# Patient Record
Sex: Male | Born: 1975 | Race: Black or African American | Hispanic: No | Marital: Married | Smoking: Never smoker
Health system: Southern US, Community
[De-identification: ages and names within clinical notes are randomized; demographics above are authoritative.]

## PROBLEM LIST (undated history)

## (undated) DIAGNOSIS — E349 Endocrine disorder, unspecified: Secondary | ICD-10-CM

## (undated) DIAGNOSIS — Z9852 Vasectomy status: Secondary | ICD-10-CM

## (undated) HISTORY — PX: OTHER SURGICAL HISTORY: SHX169

---

## 2001-10-07 ENCOUNTER — Encounter: Admission: RE | Admit: 2001-10-07 | Discharge: 2001-10-07 | Payer: Self-pay | Admitting: Family Medicine

## 2001-10-07 ENCOUNTER — Encounter: Payer: Self-pay | Admitting: Family Medicine

## 2008-07-26 ENCOUNTER — Encounter: Admission: RE | Admit: 2008-07-26 | Discharge: 2008-07-27 | Payer: Self-pay | Admitting: Family Medicine

## 2008-08-09 ENCOUNTER — Encounter (INDEPENDENT_AMBULATORY_CARE_PROVIDER_SITE_OTHER): Payer: Self-pay | Admitting: Otolaryngology

## 2008-08-09 ENCOUNTER — Ambulatory Visit (HOSPITAL_BASED_OUTPATIENT_CLINIC_OR_DEPARTMENT_OTHER): Admission: RE | Admit: 2008-08-09 | Discharge: 2008-08-09 | Payer: Self-pay | Admitting: Otolaryngology

## 2008-08-09 HISTORY — PX: OTHER SURGICAL HISTORY: SHX169

## 2010-11-06 LAB — POCT HEMOGLOBIN-HEMACUE: Hemoglobin: 14 g/dL (ref 13.0–17.0)

## 2010-12-05 NOTE — Op Note (Signed)
NAME:  Jim Howard, Jim Howard NO.:  [id number]   MEDICAL RECORD NO.:  [id number]          PATIENT TYPE:  AMB   LOCATION:  DSC                          FACILITY:  [facility]   PHYSICIAN:  Karol T. Lazarus Salines, M.D. DATE OF BIRTH:  18-Nov-1975   DATE OF PROCEDURE:  08/09/2008  DATE OF DISCHARGE:                               OPERATIVE REPORT   PREOPERATIVE DIAGNOSIS:  Chronic tonsillolith formation.   POSTOPERATIVE DIAGNOSIS:  Chronic tonsillolith formation.   PROCEDURE PERFORMED:  Tonsillectomy, adenoid ablation.   SURGEON:  Gloris Manchester. Wolicki, MD   ANESTHESIA:  General orotracheal.   BLOOD LOSS:  25 mL.   COMPLICATIONS:  None.   FINDINGS:  A very deep mandible with a bulky tongue.  A fleshy pharynx  with 2-3+ deeply embedded and fibrotic tonsils.  A long thick soft  palate and central uvula.  A 50% residual adenoid pad.   PROCEDURE:  With the patient in a comfortable supine position, general  orotracheal anesthesia was induced without difficulty.  At an  appropriate level, the table was turned 90 degrees and the patient was  placed in Trendelenburg.  A clean preparation and draping was  accomplished.  Taking care to protect lips, teeth, and endotracheal  tube, the Crowe-Davis mouth gag was introduced, expanded for  visualization, and suspended from the Mayo stand in the standard  fashion.  The blade was adequately wide, but not long enough.  The gag  was relaxed and removed.  A #4 flat blade was placed and this was  successfully positioned and expanded.  The findings were as described  above.  The nasopharynx was examined with the palate retractor and  mirror with the findings as described above.  Xylocaine 1.5% with  1:200,000 epinephrine, 10 mL total was infiltrated into the  peritonsillar planes on both sides.  Several minutes were allowed for  this to take effect.   Beginning on the right side, the tonsil was grasped and retracted  medially.  The mucosa overlying the  anterior and superior poles was  coagulated and then cut down to the capsule of the tonsil.  Using the  cautery tip as an blunt dissector, lysing fibrous bands, and coagulating  crossing vessels as identified, the tonsil was dissected from its  muscular fossa from inferiorly upwards.  The tonsil was removed in its  entirety as determined by examination of both tonsil and fossa.  A small  additional quantity of cautery rendered the fossa hemostatic.   The mouth gag was repositioned and the left tonsil was removed in  identical fashion.   After rendering the oropharynx hemostatic, there was still some bleeding  from the nasopharynx where palpation had revealed the presence of the  adenoids.  A red rubber catheter was passed through the nose and out of  the mouth to serve the palate retractor and using suction cautery in  indirect visualization, the adenoid pad was gently ablated to reduce its  volume and also for hemostasis.  This was done in several passes using  irrigation through acuteraly localize the bleeding sites.  Upon  achieving hemostasis  in the nasopharynx, the oropharynx was again  observed to be hemostatic.  At this point, the palate retractor and  mouth gag were relaxed for several minutes.  Upon re-expansion,  hemostasis was persistently freed.  At this point, the procedure was  completed.  The palate retractor and mouth gag were relaxed and removed.  The dental status was intact.  The patient was returned to anesthesia,  awakened, extubated, and transferred to recovery in stable condition.   COMMENT:  A 35 year old black male with a long history of tonsillolith  production gradually progressive including foreign body sensation in the  throat, sore throat, and mostly halitosis with the indication for  today's procedure.  Anticipate a routine postoperative recovery with  attention to analgesia, antibiosis, hydration, and observation for  bleeding, emesis, or airway  compromise.  The patient has a family  history of malignant hyperthermia and all appropriate precautions were  taken with no evidence of this disease process.  In conference  to the  Anesthesiology, it was felt appropriate for the patient to have this  surgery as an outpatient if his other parameters are intact.      Gloris Manchester. Lazarus Salines, M.D.  Electronically Signed     KTW/MEDQ  D:  08/09/2008  T:  08/10/2008  Job:  [id number]

## 2011-11-06 ENCOUNTER — Other Ambulatory Visit: Payer: Self-pay | Admitting: Urology

## 2011-11-20 ENCOUNTER — Encounter (HOSPITAL_BASED_OUTPATIENT_CLINIC_OR_DEPARTMENT_OTHER): Payer: Self-pay | Admitting: *Deleted

## 2011-11-20 NOTE — Progress Notes (Signed)
NPO AFTER MN. ARRIVES AT 0845. NEEDS HG. 

## 2011-11-21 NOTE — H&P (Signed)
1. Fatigue 780.79 2. 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 Inquiry And Counseling: Contraceptive Practices V25.09 4. Surgery Of Male Genitalia Vasectomy V25.2  History of Present Illness  Jim Howard returns today in f/u.  He has a history of a vasectomy in November but I was unable to get to the left side because of prior surgery for an undescended testicle.  He has had a reduction in sperm in his semenalysis with only 40/HPF prior to this visit but they were 40% motile.  He has hypogonadism and had been on clomid, but he stopped using it regularly and his last testosterone was down.  He is back on it now.   Past Medical History Problems  1. History of  Hypercholesterolemia 272.0  Surgical History Problems  1. History of  Hernia Repair 2. Surgery Of Male Genitalia Vasectomy V25.2 3. History of  Tonsillectomy  Current Meds 1. ClomiPHENE Citrate 50 MG Oral Tablet; Take 1/2 tab every other day for a week and then a whole  tablet every other day.   #30; Therapy: 08Nov2012 to (Last Rx:14Jan2013)  Requested for:  14Jan2013  Allergies Medication  1. No Known Drug Allergies  Family History Problems  1. Family history of  Family Health Status Number Of Children 1 son and 2 daughters  Social History Problems  1. Being A Social Drinker 2. Caffeine Use less than 1 day 3. Marital History - Currently Married 4. Never A Smoker 5. Occupation: [employer]  Review of Systems Genitourinary, constitutional, skin, eye, otolaryngeal, hematologic/lymphatic, cardiovascular, pulmonary, endocrine, musculoskeletal, gastrointestinal, neurological and psychiatric system(s) were reviewed and pertinent findings if present are noted.    Physical Exam Constitutional: Well nourished and well developed . No acute distress.  Pulmonary: No respiratory distress and normal respiratory rhythm and effort.  Cardiovascular: Heart rate and rhythm are normal . No peripheral edema.  Genitourinary: Examination of the penis demonstrates no  discharge, no masses, no lesions and a normal meatus. The scrotum is without lesions. The left vas deferens is difficult to palpate. The right epididymis is palpably normal and non-tender. The left epididymis is palpably normal and non-tender. The right testis is non-tender and without masses. The left testis is atrophic, but non-tender and without masses.  The left vas is difficult to palpate in his somewhat scarred cord from his prior orchidopexy.    Results/Data Selected Results  SEMEN ANALYSIS POST VAS 08Apr2013 08:51AM Jim Howard  SPECIMEN TYPE: OTHER   Test Name Result Flag Reference  SEMEN EXAM, POST VASECTOMY Sperm Present    40 SPERM SEEN WITH 40% MOTILITY/20HPF.   Reference Range:  No sperm noted   TESTOSTERONE 08Apr2013 08:47AM Jim Howard  SPECIMEN TYPE: BLOOD   Test Name Result Flag Reference  TESTOSTERONE, TOTAL 164.27 ng/dL L 268-341   Please note change in reference range(s).              Tanner Stage       Male              Male               I              < 30 ng/dL        < 10 ng/dL               II             < 150 ng/dL       < 30 ng/dL  III            100-320 ng/dL     < 35 ng/dL               IV             200-970 ng/dL     16-10 ng/dL               V              250-890 ng/dL     96-04 ng/dL   PSA REFLEX TO FREE [id number] 08:47AM Jim Howard  SPECIMEN TYPE: BLOOD   Test Name Result Flag Reference  PSA 0.39 ng/mL  <=4.00  Test Methodology: ECLIA PSA (Electrochemiluminescence Immunoassay)   For PSA values from 2.5-4.0, particularly in younger men <47 years old, the AUA and NCCN suggest testing for % Free PSA (3515) and evaluation of the rate of increase in PSA (PSA velocity).   CBC W/DIFF 08Apr2013 08:46AM Jim Howard  SPECIMEN TYPE: BLOOD   Test Name Result Flag Reference  WBC 6.0 K/uL  4.0-10.5  RBC 4.89 MIL/uL  4.22-5.81  HEMOGLOBIN 14.0 g/dL  47.8-29.5  HEMATOCRIT 42.5 %  39.0-52.0  MCV 86.9 fL  78.0-100.0  MCH 28.6 pg   26.0-34.0  MCHC 32.9 g/dL  62.1-30.8  RDW 65.7 %  11.5-15.5  PLATELET COUNT 234 K/uL  150-400  GRANULOCYTE % 44 %  43-77  LYMPH % 41 %  12-46  MONO % 10 %  3-12  EOS % 6 % H 0-5  BASO % 0 %  0-1  ABSOLUTE GRAN 2.6 K/uL  1.7-7.7  ABSOLUTE LYMPH 2.4 K/uL  0.7-4.0  ABSOLUTE MONO 0.6 K/uL  0.1-1.0  ABSOLUTE EOS 0.3 K/uL  0.0-0.7  ABSOLUTE BASO 0.0 K/uL  0.0-0.1  Criteria for review not met   Assessment Assessed  1. 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 Inquiry And Counseling: Contraceptive Practices V25.09   His testosterone fell off of the clomid and he noticed the decline. He still has motile sperm in the ejaculate and wants to go ahead and have the left vas dealt with.   Plan Hypogonadism (257.2)  1. ClomiPHENE Citrate 50 MG Oral Tablet; Take 1/2 tab every other day for a week and then a whole  tablet every other day.   #30; Therapy: 08Nov2012 to (Last Rx:15Apr2013)  Requested for:  15Apr2013 2. Follow-up Month x 3 Office  Follow-up  Requested for: 15Apr2013 3. TESTOSTERONE  Requested for: 15Apr2013 Inquiry And Counseling: Contraceptive Practices (V25.09)  4. Follow-up Schedule Surgery Office  Follow-up  Requested for: 15Apr2013   He will resume the clomid.  We did discuss TRT but he doesn't want to go that route at this time. I will set him up for a left vasectomy under anesthesia and he is aware of the risks from our prior discussion.

## 2011-11-22 ENCOUNTER — Encounter (HOSPITAL_BASED_OUTPATIENT_CLINIC_OR_DEPARTMENT_OTHER): Payer: Self-pay | Admitting: Anesthesiology

## 2011-11-22 ENCOUNTER — Encounter (HOSPITAL_BASED_OUTPATIENT_CLINIC_OR_DEPARTMENT_OTHER)
Admission: RE | Disposition: A | Discharge status: Home / Self Care | Payer: Self-pay | Source: Ambulatory Visit | Attending: Urology

## 2011-11-22 ENCOUNTER — Ambulatory Visit (HOSPITAL_BASED_OUTPATIENT_CLINIC_OR_DEPARTMENT_OTHER): Payer: BC Managed Care – PPO | Admitting: Anesthesiology

## 2011-11-22 ENCOUNTER — Ambulatory Visit (HOSPITAL_BASED_OUTPATIENT_CLINIC_OR_DEPARTMENT_OTHER)
Admission: RE | Admit: 2011-11-22 | Discharge: 2011-11-22 | Disposition: A | Discharge status: Home / Self Care | Payer: BC Managed Care – PPO | Source: Ambulatory Visit | Attending: Urology | Admitting: Urology

## 2011-11-22 ENCOUNTER — Encounter (HOSPITAL_BASED_OUTPATIENT_CLINIC_OR_DEPARTMENT_OTHER): Payer: Self-pay | Admitting: *Deleted

## 2011-11-22 DIAGNOSIS — IMO0002 Reserved for concepts with insufficient information to code with codable children: Secondary | ICD-10-CM | POA: Insufficient documentation

## 2011-11-22 DIAGNOSIS — E291 Testicular hypofunction: Secondary | ICD-10-CM | POA: Insufficient documentation

## 2011-11-22 DIAGNOSIS — Z79899 Other long term (current) drug therapy: Secondary | ICD-10-CM | POA: Insufficient documentation

## 2011-11-22 DIAGNOSIS — E7800 Pure hypercholesterolemia, unspecified: Secondary | ICD-10-CM | POA: Insufficient documentation

## 2011-11-22 DIAGNOSIS — Q539 Undescended testicle, unspecified: Secondary | ICD-10-CM | POA: Insufficient documentation

## 2011-11-22 DIAGNOSIS — Y838 Other surgical procedures as the cause of abnormal reaction of the patient, or of later complication, without mention of misadventure at the time of the procedure: Secondary | ICD-10-CM | POA: Insufficient documentation

## 2011-11-22 DIAGNOSIS — Z302 Encounter for sterilization: Secondary | ICD-10-CM | POA: Insufficient documentation

## 2011-11-22 HISTORY — DX: Vasectomy status: Z98.52

## 2011-11-22 HISTORY — DX: Endocrine disorder, unspecified: E34.9

## 2011-11-22 HISTORY — PX: VASECTOMY: SHX75

## 2011-11-22 SURGERY — VASECTOMY
Anesthesia: General | Site: Scrotum | Laterality: Left | Wound class: Clean

## 2011-11-22 MED ORDER — ACETAMINOPHEN 650 MG RE SUPP: 650.0000 mg | RECTAL | Status: DC | PRN

## 2011-11-22 MED ORDER — SODIUM CHLORIDE 0.9 % IJ SOLN: 3.0000 mL | Freq: Two times a day (BID) | INTRAMUSCULAR | Status: DC

## 2011-11-22 MED ORDER — FENTANYL CITRATE 0.05 MG/ML IJ SOLN: 25.0000 ug | INTRAMUSCULAR | Status: DC | PRN

## 2011-11-22 MED ORDER — LACTATED RINGERS IV SOLN
INTRAVENOUS | Status: DC
  Administered 2011-11-22: 12:00:00 via INTRAVENOUS

## 2011-11-22 MED ORDER — HYDROCODONE-ACETAMINOPHEN 5-325 MG PO TABS: 1.0000 | ORAL_TABLET | Freq: Four times a day (QID) | ORAL | Status: AC | PRN

## 2011-11-22 MED ORDER — SODIUM CHLORIDE 0.9 % IV SOLN: 250.0000 mL | INTRAVENOUS | Status: DC | PRN

## 2011-11-22 MED ORDER — ONDANSETRON HCL 4 MG/2ML IJ SOLN: 4.0000 mg | Freq: Four times a day (QID) | INTRAMUSCULAR | Status: DC | PRN

## 2011-11-22 MED ORDER — LIDOCAINE HCL (CARDIAC) 20 MG/ML IV SOLN
INTRAVENOUS | Status: DC | PRN
  Administered 2011-11-22: 60 mg via INTRAVENOUS

## 2011-11-22 MED ORDER — ACETAMINOPHEN 325 MG PO TABS: 650.0000 mg | ORAL_TABLET | ORAL | Status: DC | PRN

## 2011-11-22 MED ORDER — ONDANSETRON HCL 4 MG/2ML IJ SOLN
INTRAMUSCULAR | Status: DC | PRN
  Administered 2011-11-22: 4 mg via INTRAVENOUS

## 2011-11-22 MED ORDER — OXYCODONE HCL 5 MG PO TABS: 5.0000 mg | ORAL_TABLET | ORAL | Status: DC | PRN

## 2011-11-22 MED ORDER — CEFAZOLIN SODIUM 1-5 GM-% IV SOLN
1.0000 g | INTRAVENOUS | Status: AC
  Administered 2011-11-22: 2 g via INTRAVENOUS

## 2011-11-22 MED ORDER — FENTANYL CITRATE 0.05 MG/ML IJ SOLN
INTRAMUSCULAR | Status: DC | PRN
  Administered 2011-11-22 (×2): 50 ug via INTRAVENOUS

## 2011-11-22 MED ORDER — SODIUM CHLORIDE 0.9 % IJ SOLN: 3.0000 mL | INTRAMUSCULAR | Status: DC | PRN

## 2011-11-22 MED ORDER — PROPOFOL 10 MG/ML IV EMUL
INTRAVENOUS | Status: DC | PRN
  Administered 2011-11-22: 200 mg via INTRAVENOUS

## 2011-11-22 MED ORDER — BUPIVACAINE HCL 0.25 % IJ SOLN
INTRAMUSCULAR | Status: DC | PRN
  Administered 2011-11-22: 5 mL

## 2011-11-22 MED ORDER — 0.9 % SODIUM CHLORIDE (POUR BTL) OPTIME
TOPICAL | Status: DC | PRN
  Administered 2011-11-22: 500 mL

## 2011-11-22 MED ORDER — KETOROLAC TROMETHAMINE 30 MG/ML IJ SOLN
INTRAMUSCULAR | Status: DC | PRN
  Administered 2011-11-22: 30 mg via INTRAVENOUS

## 2011-11-22 MED ORDER — LACTATED RINGERS IV SOLN
INTRAVENOUS | Status: DC
  Administered 2011-11-22: 10:00:00 via INTRAVENOUS

## 2011-11-22 MED ORDER — PROMETHAZINE HCL 25 MG/ML IJ SOLN: 6.2500 mg | INTRAMUSCULAR | Status: DC | PRN

## 2011-11-22 MED ORDER — LIDOCAINE HCL (PF) 1 % IJ SOLN: INTRAMUSCULAR | Status: DC | PRN

## 2011-11-22 MED ORDER — DEXAMETHASONE SODIUM PHOSPHATE 4 MG/ML IJ SOLN
INTRAMUSCULAR | Status: DC | PRN
  Administered 2011-11-22: 10 mg via INTRAVENOUS

## 2011-11-22 MED ORDER — MIDAZOLAM HCL 5 MG/5ML IJ SOLN
INTRAMUSCULAR | Status: DC | PRN
  Administered 2011-11-22: 2 mg via INTRAVENOUS

## 2011-11-22 SURGICAL SUPPLY — 35 items
BANDAGE GAUZE ELAST BULKY 4 IN (GAUZE/BANDAGES/DRESSINGS) ×2 IMPLANT
BLADE SURG 15 STRL LF DISP TIS (BLADE) ×1 IMPLANT
BLADE SURG 15 STRL SS (BLADE) ×1
BLADE SURG ROTATE 9660 (MISCELLANEOUS) ×2 IMPLANT
CLEANER CAUTERY TIP 5X5 PAD (MISCELLANEOUS) ×1 IMPLANT
CLOTH BEACON ORANGE TIMEOUT ST (SAFETY) ×2 IMPLANT
COVER MAYO STAND STRL (DRAPES) ×2 IMPLANT
COVER TABLE BACK 60X90 (DRAPES) ×2 IMPLANT
DRAPE PED LAPAROTOMY (DRAPES) ×2 IMPLANT
ELECT NEEDLE TIP 2.8 STRL (NEEDLE) ×2 IMPLANT
ELECT REM PT RETURN 9FT ADLT (ELECTROSURGICAL) ×2
ELECTRODE REM PT RTRN 9FT ADLT (ELECTROSURGICAL) ×1 IMPLANT
GLOVE BIOGEL PI IND STRL 6.5 (GLOVE) ×2 IMPLANT
GLOVE BIOGEL PI INDICATOR 6.5 (GLOVE) ×2
GLOVE ECLIPSE 6.0 STRL STRAW (GLOVE) ×2 IMPLANT
GLOVE SURG SS PI 8.0 STRL IVOR (GLOVE) ×2 IMPLANT
GOWN SURGICAL LARGE (GOWNS) ×2 IMPLANT
GOWN W/COTTON TOWEL STD LRG (GOWNS) ×2 IMPLANT
GOWN XL W/COTTON TOWEL STD (GOWNS) ×2 IMPLANT
MARKER SKIN DUAL TIP RULER LAB (MISCELLANEOUS) ×2 IMPLANT
NEEDLE 27GAX1X1/2 (NEEDLE) IMPLANT
NEEDLE HYPO 25X1 1.5 SAFETY (NEEDLE) ×2 IMPLANT
NS IRRIG 500ML POUR BTL (IV SOLUTION) ×2 IMPLANT
PACK BASIN DAY SURGERY FS (CUSTOM PROCEDURE TRAY) ×2 IMPLANT
PAD CLEANER CAUTERY TIP 5X5 (MISCELLANEOUS) ×1
PENCIL BUTTON HOLSTER BLD 10FT (ELECTRODE) ×2 IMPLANT
SUPPORT SCROTAL LG STRP (MISCELLANEOUS) ×2 IMPLANT
SUT CHROMIC 3 0 SH 27 (SUTURE) ×4 IMPLANT
SUT VIC AB 2-0 SH 27 (SUTURE) ×1
SUT VIC AB 2-0 SH 27XBRD (SUTURE) ×1 IMPLANT
SUT VICRYL 0 TIES 12 18 (SUTURE) ×2 IMPLANT
SYR CONTROL 10ML LL (SYRINGE) IMPLANT
TOWEL NATURAL 6PK STERILE (DISPOSABLE) ×2 IMPLANT
TOWEL OR 17X24 6PK STRL BLUE (TOWEL DISPOSABLE) ×2 IMPLANT
WATER STERILE IRR 500ML POUR (IV SOLUTION) IMPLANT

## 2011-11-22 NOTE — Discharge Instructions (Addendum)
Vasectomy Care After Refer to this sheet in the next few weeks. These instructions provide you with information on caring for yourself after your procedure. Your caregiver may also give you more specific instructions. Your treatment has been planned according to current medical practices, but problems sometimes occur. Call your caregiver if you have any problems or questions after your procedure. HOME CARE INSTRUCTIONS   Only take over-the-counter or prescription medicines for pain, discomfort, or fever as directed by your caregiver.   Avoid using non-steroidal anti-inflammatory drugs (NSAID's) as these can make bleeding worse.   An ice pack for twenty to thirty minutes, 4 to 6 times per day for the first 2 or 3 days, will help decrease swelling.   Avoid being active for the first 2 days following surgery.   Wear a supporter while moving around for the first week following surgery.   Some oozing of blood from the cuts (incisions) by the surgeon is normal during the first day or two following the procedure.   Do not participate in sports or perform heavy physical labor for at least two weeks.   Blood in the ejaculate is common and typically clears after a few days.   Be sure to follow-up with your surgeon as instructed to confirm sterility.  SEEK MEDICAL CARE IF:   There is redness, swelling, or increasing pain in the wounds or testicles (scrotum).   Pus is coming from wound.   An unexplained oral temperature above 102 F (38.9 C) develops.   You notice a foul smell coming from the wound or dressing.   There is a breaking open of the stitches (suture) line or wound edges even after sutures have been removed.   There is increased bleeding from the wounds.  SEEK IMMEDIATE MEDICAL CARE IF:   You develop a rash.   You have difficulty breathing.   You develop any reaction or side effects to medications given.  Document Released: 01/26/2005 Document Revised: 06/28/2011 Document  Reviewed: 08/18/2007 [facility] Patient Information 2012 [place], [place]. Post Anesthesia Home Care Instructions  Activity: Get plenty of rest for the remainder of the day. A responsible adult should stay with you for 24 hours following the procedure.  For the next 24 hours, DO NOT: -Drive a car -Advertising copywriter -Drink alcoholic beverages -Take any medication unless instructed by your physician -Make any legal decisions or sign important papers.  Meals: Start with liquid foods such as gelatin or soup. Progress to regular foods as tolerated. Avoid greasy, spicy, heavy foods. If nausea and/or vomiting occur, drink only clear liquids until the nausea and/or vomiting subsides. Call your physician if vomiting continues.  Special Instructions/Symptoms: Your throat may feel dry or sore from the anesthesia or the breathing tube placed in your throat during surgery. If this causes discomfort, gargle with warm salt water. The discomfort should disappear within 24 hours.   Post Anesthesia Home Care Instructions  Activity: Get plenty of rest for the remainder of the day. A responsible adult should stay with you for 24 hours following the procedure.  For the next 24 hours, DO NOT: -Drive a car -Advertising copywriter -Drink alcoholic beverages -Take any medication unless instructed by your physician -Make any legal decisions or sign important papers.  Meals: Start with liquid foods such as gelatin or soup. Progress to regular foods as tolerated. Avoid greasy, spicy, heavy foods. If nausea and/or vomiting occur, drink only clear liquids until the nausea and/or vomiting subsides. Call your physician if vomiting continues.  Special Instructions/Symptoms: Your throat may feel dry or sore from the anesthesia or the breathing tube placed in your throat during surgery. If this causes discomfort, gargle with warm salt water. The discomfort should disappear within 24 hours.   Post Anesthesia Home Care  Instructions  Activity: Get plenty of rest for the remainder of the day. A responsible adult should stay with you for 24 hours following the procedure.  For the next 24 hours, DO NOT: -Drive a car -Advertising copywriter -Drink alcoholic beverages -Take any medication unless instructed by your physician -Make any legal decisions or sign important papers.  Meals: Start with liquid foods such as gelatin or soup. Progress to regular foods as tolerated. Avoid greasy, spicy, heavy foods. If nausea and/or vomiting occur, drink only clear liquids until the nausea and/or vomiting subsides. Call your physician if vomiting continues.  Special Instructions/Symptoms: Your throat may feel dry or sore from the anesthesia or the breathing tube placed in your throat during surgery. If this causes discomfort, gargle with warm salt water. The discomfort should disappear within 24 hours.

## 2011-11-22 NOTE — Anesthesia Preprocedure Evaluation (Addendum)
Anesthesia Evaluation  Patient identified by MRN, date of birth, ID band Patient awake    Reviewed: Allergy & Precautions, H&P , NPO status , Patient's Chart, lab work & pertinent test results  History of Anesthesia Complications Negative for: history of anesthetic complications (Family history significant for MH in first cousin; patient undergone prior anesthesia without complications)  Airway Mallampati: II TM Distance: >3 FB Neck ROM: Full    Dental No notable dental hx. (+) Teeth Intact and Dental Advisory Given   Pulmonary neg pulmonary ROS,  breath sounds clear to auscultation  Pulmonary exam normal       Cardiovascular negative cardio ROS  Rhythm:Regular Rate:Normal     Neuro/Psych negative neurological ROS  negative psych ROS   GI/Hepatic negative GI ROS, Neg liver ROS,   Endo/Other  negative endocrine ROS  Renal/GU negative Renal ROS  negative genitourinary   Musculoskeletal negative musculoskeletal ROS (+)   Abdominal   Peds negative pediatric ROS (+)  Hematology negative hematology ROS (+)   Anesthesia Other Findings   Reproductive/Obstetrics negative OB ROS                          Anesthesia Physical Anesthesia Plan  ASA: I  Anesthesia Plan: General   Post-op Pain Management:    Induction: Intravenous  Airway Management Planned: LMA  Additional Equipment:   Intra-op Plan:   Post-operative Plan: Extubation in OR  Informed Consent: I have reviewed the patients History and Physical, chart, labs and discussed the procedure including the risks, benefits and alternatives for the proposed anesthesia with the patient or authorized representative who has indicated his/her understanding and acceptance.   Dental advisory given  Plan Discussed with: CRNA  Anesthesia Plan Comments:         Anesthesia Quick Evaluation

## 2011-11-22 NOTE — Anesthesia Procedure Notes (Signed)
Procedure Name: LMA Insertion Date/Time: 11/22/2011 9:50 AM Performed by: Maris Berger T Pre-anesthesia Checklist: Patient identified, Emergency Drugs available, Suction available and Patient being monitored Patient Re-evaluated:Patient Re-evaluated prior to inductionOxygen Delivery Method: Circle System Utilized Preoxygenation: Pre-oxygenation with 100% oxygen Intubation Type: IV induction Ventilation: Mask ventilation without difficulty LMA: LMA inserted LMA Size: 5.0 Number of attempts: 1 Airway Equipment and Method: bite block Placement Confirmation: positive ETCO2 Dental Injury: Teeth and Oropharynx as per pre-operative assessment

## 2011-11-22 NOTE — Interval H&P Note (Signed)
History and Physical Interval Note:  11/22/2011 7:24 AM  Jim Howard  has presented today for surgery, with the diagnosis of FAILED VASECTOMY  The various methods of treatment have been discussed with the patient and family. After consideration of risks, benefits and other options for treatment, the patient has consented to  Procedure(s) (LRB): VASECTOMY (Left) as a surgical intervention .  The patients' history has been reviewed, patient examined, no change in status, stable for surgery.  I have reviewed the patients' chart and labs.  Questions were answered to the patient's satisfaction.     Jim Howard J

## 2011-11-22 NOTE — Brief Op Note (Signed)
11/22/2011  10:47 AM  PATIENT:  Steward Drone  36 y.o. male  PRE-OPERATIVE DIAGNOSIS:  FAILED VASECTOMY  POST-OPERATIVE DIAGNOSIS:  FAILED VASECTOMY  PROCEDURE:  Procedure(s) (LRB): VASECTOMY (Left)  SURGEON:  Surgeon(s) and Role:    * Anner Crete, MD - Primary  PHYSICIAN ASSISTANT:   ASSISTANTS: none   ANESTHESIA:   general  EBL:  Total I/O In: 100 [I.V.:100] Out: -   BLOOD ADMINISTERED:none  DRAINS: none   LOCAL MEDICATIONS USED:  MARCAINE     SPECIMEN:  No Specimen and Source of Specimen:  left vas deferens  DISPOSITION OF SPECIMEN:  PATHOLOGY  COUNTS:  YES  TOURNIQUET:  * No tourniquets in log *  DICTATION: .Other Dictation: Dictation Number [id number]  PLAN OF CARE: Discharge to home after PACU  PATIENT DISPOSITION:  PACU - hemodynamically stable.   Delay start of Pharmacological VTE agent (>24hrs) due to surgical blood loss or risk of bleeding: yes

## 2011-11-22 NOTE — Anesthesia Postprocedure Evaluation (Signed)
  Anesthesia Post-op Note  Patient: Jim Howard  Procedure(s) Performed: Procedure(s) (LRB): VASECTOMY (Left)  Patient Location: PACU  Anesthesia Type: General  Level of Consciousness: awake and alert   Airway and Oxygen Therapy: Patient Spontanous Breathing  Post-op Pain: mild  Post-op Assessment: Post-op Vital signs reviewed, Patient's Cardiovascular Status Stable, Respiratory Function Stable, Patent Airway and No signs of Nausea or vomiting  Post-op Vital Signs: stable  Complications: No apparent anesthesia complications

## 2011-11-22 NOTE — Transfer of Care (Signed)
Immediate Anesthesia Transfer of Care Note  Patient: Jim Howard  Procedure(s) Performed: Procedure(s) (LRB): VASECTOMY (Left)  Patient Location: PACU  Anesthesia Type: General  Level of Consciousness: awake and oriented  Airway & Oxygen Therapy: Patient Spontanous Breathing and Patient connected to nasal cannula oxygen  Post-op Assessment: Report given to PACU RN  Post vital signs: Reviewed and stable  Complications: No apparent anesthesia complications

## 2011-11-23 ENCOUNTER — Encounter (HOSPITAL_BASED_OUTPATIENT_CLINIC_OR_DEPARTMENT_OTHER): Payer: Self-pay | Admitting: Urology

## 2011-11-23 NOTE — Op Note (Signed)
NAME:  Jim Howard            ACCOUNT NO.:  [id number]  MEDICAL RECORD NO.:  [id number]  LOCATION:                                 FACILITY:  PHYSICIAN:  Excell Seltzer. Annabell Howells, M.D.    DATE OF BIRTH:  [birth year]  DATE OF PROCEDURE:  11/22/2011 DATE OF DISCHARGE:                              OPERATIVE REPORT   PROCEDURE:  Left vasectomy.  PREOPERATIVE DIAGNOSIS:  Left undescended testicle with prior orchidopexy and failed vasectomy attempt in the office.  POSTOPERATIVE DIAGNOSIS:  Left undescended testicle with prior orchidopexy and failed vasectomy attempt in the office.  SURGEON:  Excell Seltzer. Annabell Howells, M.D.  ANESTHESIA:  General.  SPECIMEN:  Vas deferens.  DRAINS:  None.  COMPLICATIONS:  None.  INDICATIONS:  Jim Howard is a 36 year old African American male who has a history of left undescended testicle with orchiopexy many years ago. When I initially evaluated him for vasectomy in the office, I thought I was able to feel the left vas with sufficient mobility to perform an office vasectomy.  That procedure was performed several weeks ago and I was able to successfully ligate the right vas, but the left vas proved to be elusive and I was unable to dissected out sufficiently to perform a vasectomy.  He returns to the OR today for completion of the procedure since his post vas semen analyses showed motile sperm.  FINDINGS AND PROCEDURE:  He was given 2 g Ancef.  He was taken to the operating room, where general anesthetic was induced.  He was placed in the supine position and his scrotum was clipped.  He was prepped with Betadine solution, draped in usual sterile fashion.  An initial attempt to perform the vasectomy using no scalpel technique was quickly aborted as I was unable to palpate the vas with sufficient confidence to further the dissection.  I then made an oblique incision in the superior left scrotum and delivered the testicle from the wound.  There was considerable  peritesticular adhesions from the prior orchiopexy that were meticulously taken down exposing the testicle and approximately 4-5 cm of the spermatic cord.  Once I isolated out the cord, I was able to palpate the vas.  This was then dissected out using a sharpened hemostat and encircled with small towel clips.  I dissected out the vas and area of the convoluted vas deferens just at the junction with the epididymis.  In this situation, I doubly ligated the testicular side with an 0 Vicryl tie and I then divided the vas, removed dissection for pathology, and attempted to fulgurate the lumen but because of the convoluted nature of the vas deferens and a small caliber, I was unable to get the needle point Bovie into the lumen; so at this point, the distal end of the vas was doubly ligated with 0 Vicryl ties.  The area was inspected for hemostasis and the two ends were then separated with fascial interposition.  At this point, the cord was infiltrated with 5 cc of 0.25% Marcaine. Final inspection for hemostasis revealed no bleeding.  I did dissect more proximally on the testicular side of the vas to ensure that I dealt with the correct structure  and confirmed that I was dealing with the convoluted vas and distal epididymis.  At this point, the testicle was returned to the left hemiscrotum.  The wound was closed in two layers using a running 3-0 chromic on the dartos and interrupted 3-0 chromic on the skin.  The very small puncture site in the mid scrotum was felt to be sufficiently small that it should heal without closure.  At this point, a dressing of 4x4's, a fluff Kerlix, and an athletic supporter was applied.  The patient's anesthetic was reversed.  He was moved to recovery room in stable condition.  There were no complications.     Excell Seltzer. Annabell Howells, M.D.     JJW/MEDQ  D:  11/22/2011  T:  11/23/2011  Job:  [id number]

## 2011-11-28 ENCOUNTER — Encounter (HOSPITAL_BASED_OUTPATIENT_CLINIC_OR_DEPARTMENT_OTHER): Payer: Self-pay

## 2012-02-05 ENCOUNTER — Other Ambulatory Visit: Payer: Self-pay | Admitting: Urology

## 2012-02-05 DIAGNOSIS — E291 Testicular hypofunction: Secondary | ICD-10-CM

## 2012-02-08 ENCOUNTER — Ambulatory Visit (HOSPITAL_COMMUNITY)
Admission: RE | Admit: 2012-02-08 | Discharge: 2012-02-08 | Disposition: A | Discharge status: Home / Self Care | Payer: BC Managed Care – PPO | Source: Ambulatory Visit | Attending: Urology | Admitting: Urology

## 2012-02-08 DIAGNOSIS — E291 Testicular hypofunction: Secondary | ICD-10-CM

## 2012-02-08 DIAGNOSIS — J3489 Other specified disorders of nose and nasal sinuses: Secondary | ICD-10-CM | POA: Insufficient documentation

## 2012-02-08 DIAGNOSIS — R61 Generalized hyperhidrosis: Secondary | ICD-10-CM | POA: Insufficient documentation

## 2012-02-08 MED ORDER — GADOBENATE DIMEGLUMINE 529 MG/ML IV SOLN
15.0000 mL | Freq: Once | INTRAVENOUS | Status: AC | PRN
  Administered 2012-02-08: 15 mL via INTRAVENOUS

## 2013-09-17 ENCOUNTER — Ambulatory Visit: Payer: Self-pay | Admitting: Podiatry

## 2013-10-06 ENCOUNTER — Ambulatory Visit (INDEPENDENT_AMBULATORY_CARE_PROVIDER_SITE_OTHER): Payer: BC Managed Care – PPO | Admitting: Podiatry

## 2013-10-06 ENCOUNTER — Ambulatory Visit (INDEPENDENT_AMBULATORY_CARE_PROVIDER_SITE_OTHER): Payer: BC Managed Care – PPO

## 2013-10-06 ENCOUNTER — Encounter: Payer: Self-pay | Admitting: Podiatry

## 2013-10-06 DIAGNOSIS — R52 Pain, unspecified: Secondary | ICD-10-CM

## 2013-10-06 DIAGNOSIS — M722 Plantar fascial fibromatosis: Secondary | ICD-10-CM

## 2013-10-06 MED ORDER — METHYLPREDNISOLONE (PAK) 4 MG PO TABS: ORAL_TABLET | ORAL | Status: DC

## 2013-10-06 MED ORDER — MELOXICAM 15 MG PO TABS: 15.0000 mg | ORAL_TABLET | Freq: Every day | ORAL | Status: DC

## 2013-10-06 NOTE — Progress Notes (Signed)
   Subjective:    Patient ID: Jim Howard, male    DOB: 14-Jul-1976, 38 y.o.   MRN: [id number]  HPI PT STATED BOTH HEEL ARE SORE FOR 1 YEAR. THE FEET ARE ABOUT THE SAME. THE FEET GET AGGRAVATED BY WALKING AND PUTTING PRESSURE. TRIED INSERTS AND CAM WALKER, ROLLING TENNIS BALL IT HELPS BUT STILL PAINFUL.    Review of Systems  All other systems reviewed and are negative.       Objective:   Physical Exam I have reviewed his past history medications allergies surgeries and social history. Pulses are strongly palpable right foot. Pain on palpation medial continued tubercle of the right heel. Radiographs reviewed do demonstrate soft tissue increase in density plantar fascial calcaneal insertion site indicative of plantar fasciitis.        Assessment & Plan:  Assessment: Plantar fasciitis right.  Plan: We discussed etiology pathology conservative versus surgical therapies at this point I reinjected the right heel today plantar fascial brace was distributed and orthotics were scanned. I will followup with him once those come in.

## 2013-11-22 ENCOUNTER — Emergency Department (HOSPITAL_BASED_OUTPATIENT_CLINIC_OR_DEPARTMENT_OTHER): Payer: BC Managed Care – PPO

## 2013-11-22 ENCOUNTER — Emergency Department (HOSPITAL_BASED_OUTPATIENT_CLINIC_OR_DEPARTMENT_OTHER)
Admission: EM | Admit: 2013-11-22 | Discharge: 2013-11-22 | Disposition: A | Discharge status: Other Acute Inpatient Hospital | Payer: BC Managed Care – PPO | Attending: Emergency Medicine | Admitting: Emergency Medicine

## 2013-11-22 ENCOUNTER — Encounter (HOSPITAL_BASED_OUTPATIENT_CLINIC_OR_DEPARTMENT_OTHER): Payer: Self-pay | Admitting: Emergency Medicine

## 2013-11-22 DIAGNOSIS — Z23 Encounter for immunization: Secondary | ICD-10-CM | POA: Insufficient documentation

## 2013-11-22 DIAGNOSIS — W010XXA Fall on same level from slipping, tripping and stumbling without subsequent striking against object, initial encounter: Secondary | ICD-10-CM | POA: Insufficient documentation

## 2013-11-22 DIAGNOSIS — Y9302 Activity, running: Secondary | ICD-10-CM | POA: Insufficient documentation

## 2013-11-22 DIAGNOSIS — Z791 Long term (current) use of non-steroidal anti-inflammatories (NSAID): Secondary | ICD-10-CM | POA: Insufficient documentation

## 2013-11-22 DIAGNOSIS — S52209B Unspecified fracture of shaft of unspecified ulna, initial encounter for open fracture type I or II: Secondary | ICD-10-CM

## 2013-11-22 DIAGNOSIS — S52309B Unspecified fracture of shaft of unspecified radius, initial encounter for open fracture type I or II: Principal | ICD-10-CM | POA: Insufficient documentation

## 2013-11-22 DIAGNOSIS — S5290XA Unspecified fracture of unspecified forearm, initial encounter for closed fracture: Secondary | ICD-10-CM

## 2013-11-22 DIAGNOSIS — Y929 Unspecified place or not applicable: Secondary | ICD-10-CM | POA: Insufficient documentation

## 2013-11-22 DIAGNOSIS — Z8639 Personal history of other endocrine, nutritional and metabolic disease: Secondary | ICD-10-CM | POA: Insufficient documentation

## 2013-11-22 DIAGNOSIS — Z862 Personal history of diseases of the blood and blood-forming organs and certain disorders involving the immune mechanism: Secondary | ICD-10-CM | POA: Insufficient documentation

## 2013-11-22 MED ORDER — MORPHINE SULFATE 4 MG/ML IJ SOLN
4.0000 mg | Freq: Once | INTRAMUSCULAR | Status: AC
  Administered 2013-11-22: 4 mg via INTRAVENOUS
  Filled 2013-11-22: qty 1

## 2013-11-22 MED ORDER — TETANUS-DIPHTH-ACELL PERTUSSIS 5-2.5-18.5 LF-MCG/0.5 IM SUSP
0.5000 mL | Freq: Once | INTRAMUSCULAR | Status: AC
  Administered 2013-11-22: 0.5 mL via INTRAMUSCULAR
  Filled 2013-11-22: qty 0.5

## 2013-11-22 MED ORDER — FENTANYL CITRATE 0.05 MG/ML IJ SOLN
100.0000 ug | Freq: Once | INTRAMUSCULAR | Status: AC
  Administered 2013-11-22: 100 ug via INTRAVENOUS
  Filled 2013-11-22: qty 2

## 2013-11-22 MED ORDER — CEFAZOLIN SODIUM 1-5 GM-% IV SOLN
1.0000 g | Freq: Once | INTRAVENOUS | Status: AC
  Administered 2013-11-22: 1 g via INTRAVENOUS
  Filled 2013-11-22: qty 50

## 2013-11-22 MED ORDER — SODIUM CHLORIDE 0.9 % IV SOLN
Freq: Once | INTRAVENOUS | Status: AC
  Administered 2013-11-22: 03:00:00 via INTRAVENOUS

## 2013-11-22 NOTE — ED Notes (Signed)
[facility]

## 2013-11-22 NOTE — ED Notes (Signed)
[facility] here and [facility] pt  [facility]. Medicated for pain prior to leaving ED
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
## 2013-11-22 NOTE — ED Notes (Signed)
Report given to [facility] 

## 2013-11-22 NOTE — ED Notes (Signed)
I met patient in waiting room, he ran into main room holding arm and shouting. Patient has obvious deformity to left forearm with dime sized puncture wound. Patient was not sure if bone made puncture, or if from something on ground. Bleeding controlled on arrival. Patient accompanied by several friends. I made quick cardboard splint to cradle forearm, 4x4's over puncture.

## 2013-11-22 NOTE — Progress Notes (Signed)
Patient states he has a history of spontaneous pneumothorax and what he is currently experiencing feels like a pneumothorax.  RT placed patient on 100% per MD.

## 2013-11-22 NOTE — ED Notes (Addendum)
States he was running and tripped landing on his left arm. This happened around 0130. Arm is currently wrapped wrapped and in a splint. Will assess with MD. Pt reports that his arm was "deformed"  and a small puncture wound noted with bleeding. Last eaten around 1am and has been drinking etoh. Denies loc or hitting his head.

## 2013-11-22 NOTE — ED Notes (Signed)
Report received 

## 2013-11-22 NOTE — ED Notes (Signed)
Transported to xray 

## 2013-11-22 NOTE — ED Notes (Signed)
Instructed pt to remain NPO. Awaiting [facility] to [facility]

## 2013-11-23 DIAGNOSIS — S5290XB Unspecified fracture of unspecified forearm, initial encounter for open fracture type I or II: Secondary | ICD-10-CM | POA: Insufficient documentation

## 2013-11-27 ENCOUNTER — Other Ambulatory Visit: Payer: BC Managed Care – PPO

## 2014-01-07 ENCOUNTER — Encounter: Payer: Self-pay | Admitting: Podiatry

## 2014-01-07 ENCOUNTER — Ambulatory Visit (INDEPENDENT_AMBULATORY_CARE_PROVIDER_SITE_OTHER): Payer: BC Managed Care – PPO | Admitting: Podiatry

## 2014-01-07 VITALS — BP 160/88 | HR 84 | Resp 15 | Ht 73.0 in | Wt 230.0 lb

## 2014-01-07 DIAGNOSIS — M722 Plantar fascial fibromatosis: Secondary | ICD-10-CM

## 2014-01-07 NOTE — Patient Instructions (Signed)
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
## 2014-01-12 DIAGNOSIS — M79632 Pain in left forearm: Secondary | ICD-10-CM | POA: Insufficient documentation

## 2014-01-14 ENCOUNTER — Encounter: Payer: Self-pay | Admitting: Podiatry

## 2014-01-14 ENCOUNTER — Ambulatory Visit (INDEPENDENT_AMBULATORY_CARE_PROVIDER_SITE_OTHER): Payer: BC Managed Care – PPO | Admitting: Podiatry

## 2014-01-14 VITALS — BP 174/83 | HR 82 | Resp 15 | Ht 73.0 in | Wt 230.0 lb

## 2014-01-14 DIAGNOSIS — M722 Plantar fascial fibromatosis: Secondary | ICD-10-CM

## 2014-01-14 NOTE — Progress Notes (Signed)
He presents today for a chief complaint of plantar fasciitis to his right heel.  Objective: Pulses are palpable right. Pain on palpation medial continued tubercle right heel.  Assessment: Plantar fasciitis right.  Plan: Injected the right heel today with Kenalog and local anesthetic continue all other conservative therapies and will followup with him in one month.

## 2014-02-18 ENCOUNTER — Encounter: Payer: Self-pay | Admitting: Podiatry

## 2014-02-18 ENCOUNTER — Ambulatory Visit (INDEPENDENT_AMBULATORY_CARE_PROVIDER_SITE_OTHER): Payer: BC Managed Care – PPO | Admitting: Podiatry

## 2014-02-18 VITALS — BP 140/92 | HR 94 | Resp 16

## 2014-02-18 DIAGNOSIS — M722 Plantar fascial fibromatosis: Secondary | ICD-10-CM

## 2014-02-18 MED ORDER — MELOXICAM 15 MG PO TABS: 15.0000 mg | ORAL_TABLET | Freq: Every day | ORAL | Status: DC

## 2014-02-18 NOTE — Progress Notes (Signed)
He presents today for followup of his plantar fasciitis right heel. He states that he has no complaints in the heel feels much better.  Objective: Vital signs are stable he is alert and oriented x3. Pulses are palpable right heel demonstrates no pain on palpation medial continued tubercle.  Assessment: Resolving plantar fasciitis right foot.  Plan: Continue all conservative therapies followup with me as needed.

## 2014-04-16 DIAGNOSIS — E663 Overweight: Secondary | ICD-10-CM | POA: Insufficient documentation

## 2014-09-03 IMAGING — CR DG FOREARM 2V*L*
3 series · 3 of 3 positions shown · non-contrast
Comparison: None.

CLINICAL DATA: Patient tripped and landed on the left arm.
Deformity and wound.

EXAM:
LEFT FOREARM - 2 VIEW

[view not recorded (1 of 3)]
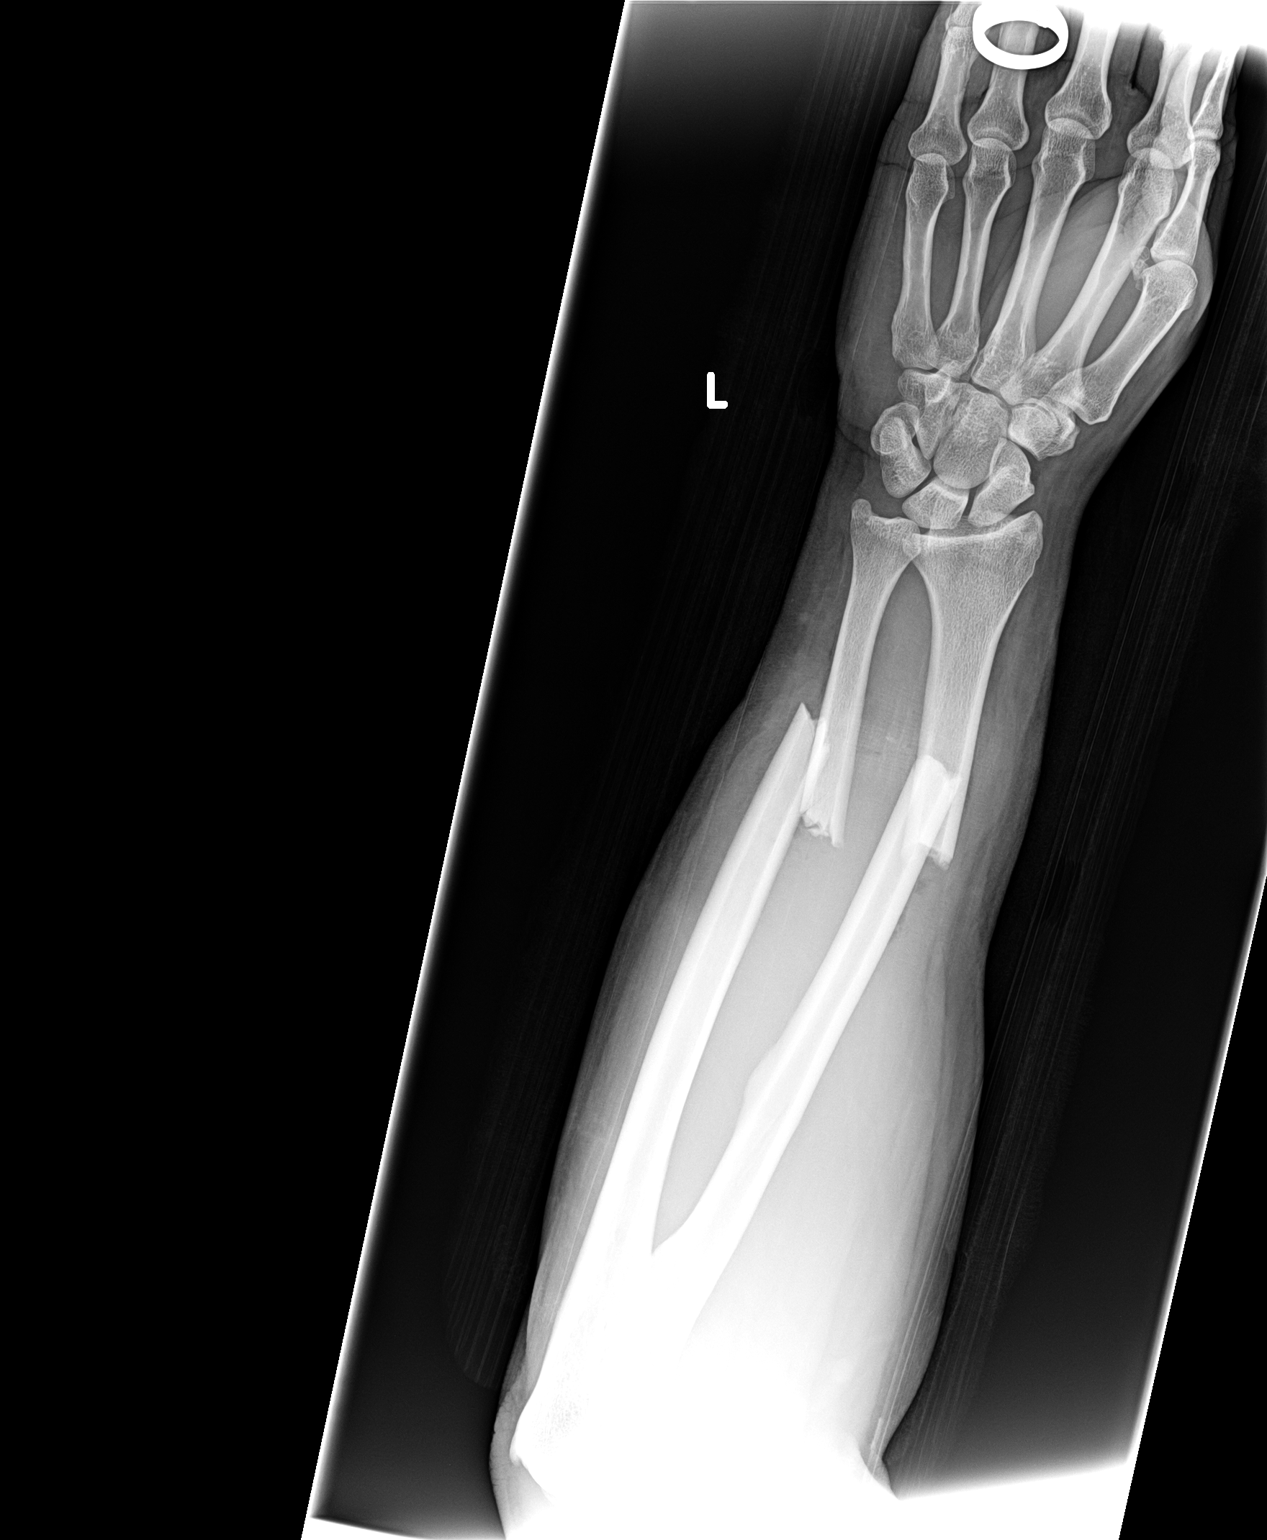

[view not recorded (2 of 3)]
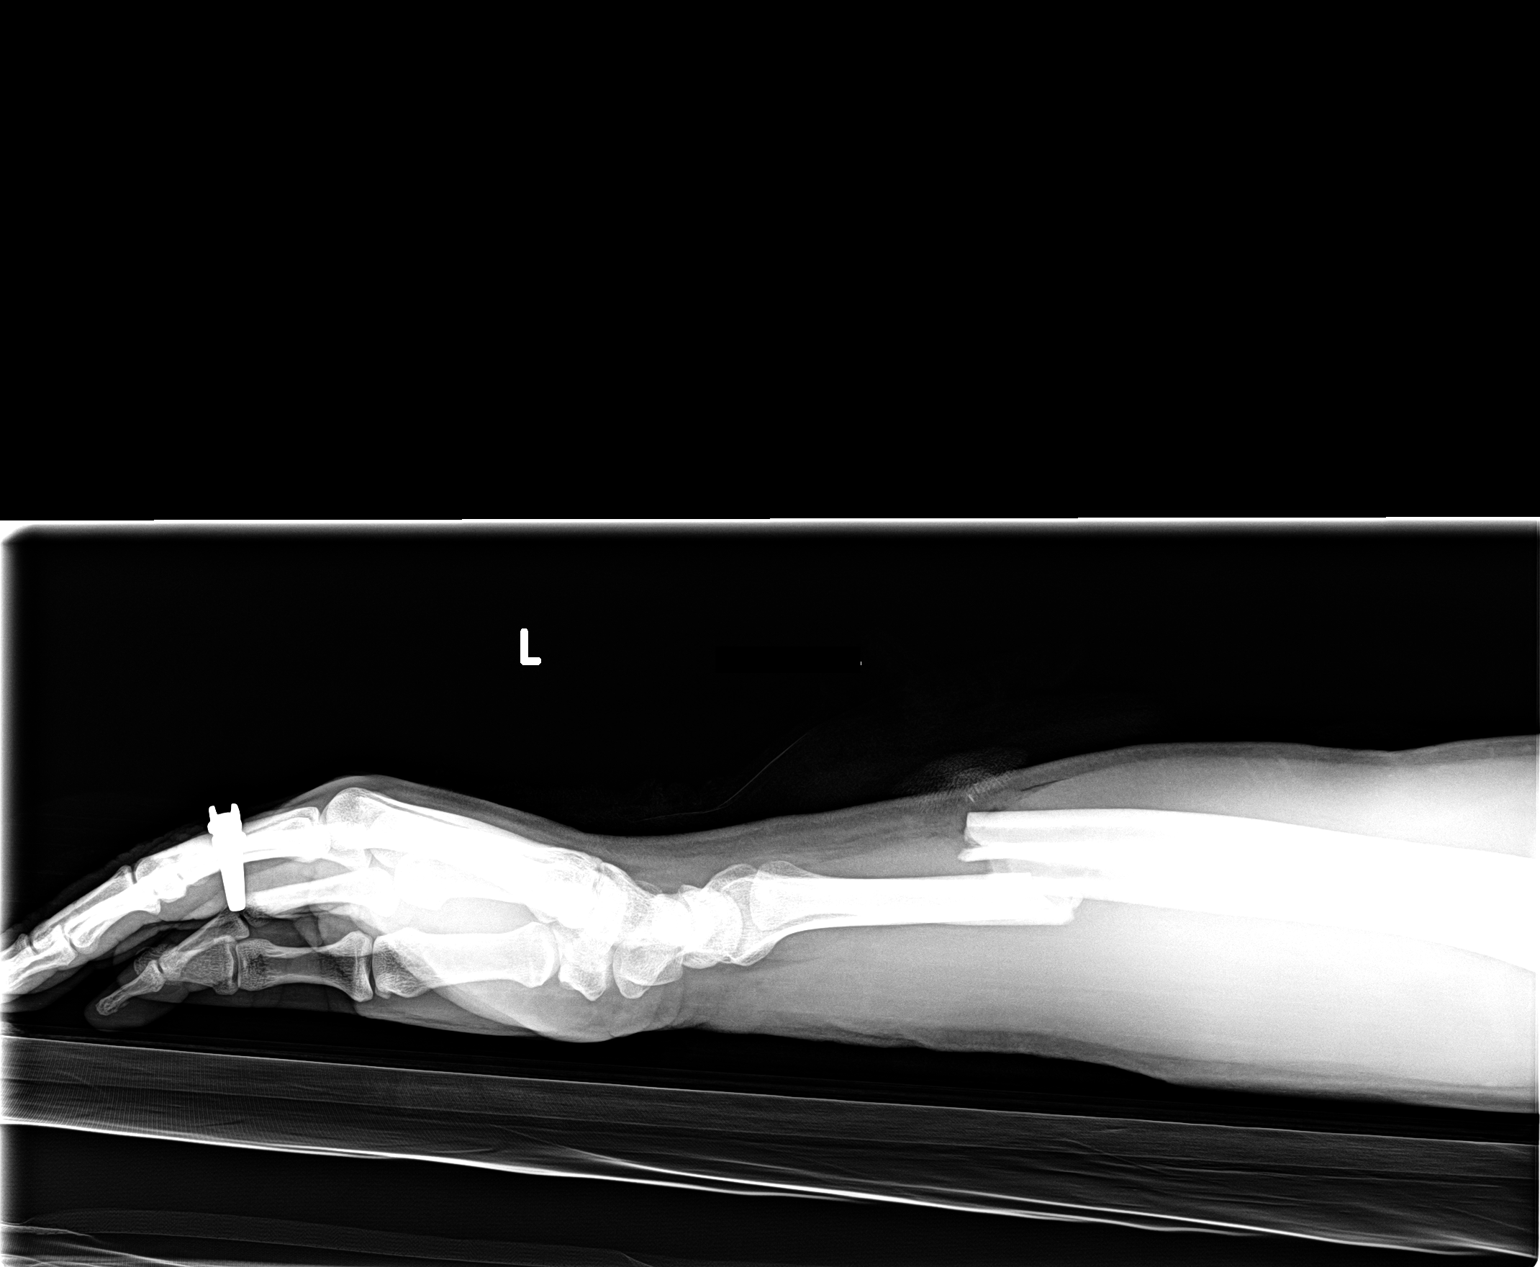

[view not recorded (3 of 3)]
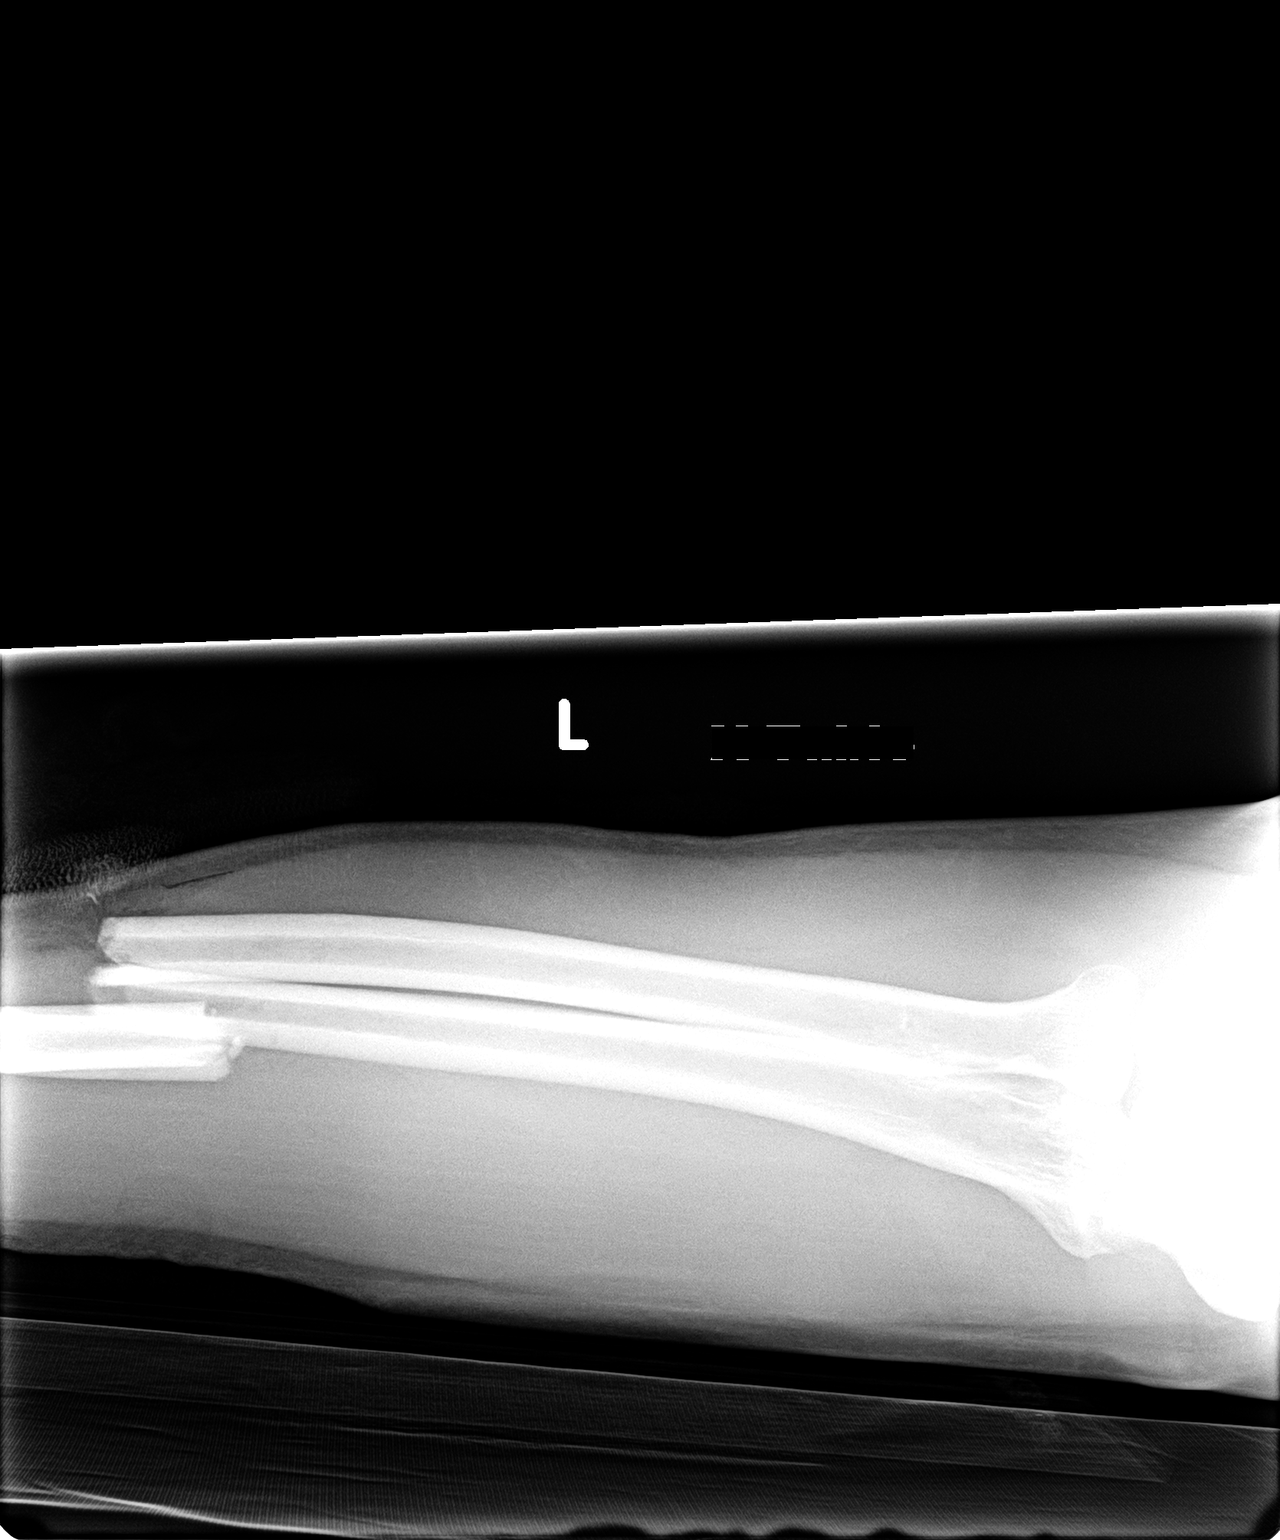

[3 of 3 positions shown; findings below may reference images not displayed]

FINDINGS: Transverse fractures of the mid shafts of left radius and ulna with
ulnar side angulation and volar displacement and overriding of the
distal fracture fragments. The soft tissue swelling.
IMPRESSION: Fractures of the mid shafts of left radius and ulna with
displacement.

## 2015-08-30 DIAGNOSIS — E7800 Pure hypercholesterolemia, unspecified: Secondary | ICD-10-CM | POA: Insufficient documentation

## 2015-08-31 DIAGNOSIS — M25462 Effusion, left knee: Secondary | ICD-10-CM | POA: Insufficient documentation

## 2015-08-31 DIAGNOSIS — R7989 Other specified abnormal findings of blood chemistry: Secondary | ICD-10-CM | POA: Insufficient documentation

## 2015-08-31 DIAGNOSIS — Z0000 Encounter for general adult medical examination without abnormal findings: Secondary | ICD-10-CM | POA: Insufficient documentation

## 2015-08-31 DIAGNOSIS — M533 Sacrococcygeal disorders, not elsewhere classified: Secondary | ICD-10-CM | POA: Insufficient documentation

## 2015-09-12 DIAGNOSIS — M2242 Chondromalacia patellae, left knee: Secondary | ICD-10-CM | POA: Insufficient documentation

## 2015-09-12 DIAGNOSIS — M25561 Pain in right knee: Secondary | ICD-10-CM | POA: Insufficient documentation

## 2016-07-31 ENCOUNTER — Other Ambulatory Visit: Payer: Self-pay | Admitting: *Deleted

## 2016-07-31 ENCOUNTER — Ambulatory Visit (INDEPENDENT_AMBULATORY_CARE_PROVIDER_SITE_OTHER): Payer: BLUE CROSS/BLUE SHIELD

## 2016-07-31 ENCOUNTER — Encounter: Payer: Self-pay | Admitting: Podiatry

## 2016-07-31 ENCOUNTER — Ambulatory Visit (INDEPENDENT_AMBULATORY_CARE_PROVIDER_SITE_OTHER): Payer: BLUE CROSS/BLUE SHIELD | Admitting: Podiatry

## 2016-07-31 VITALS — BP 143/93 | HR 73 | Resp 16

## 2016-07-31 DIAGNOSIS — M722 Plantar fascial fibromatosis: Secondary | ICD-10-CM

## 2016-07-31 DIAGNOSIS — M7662 Achilles tendinitis, left leg: Secondary | ICD-10-CM | POA: Diagnosis not present

## 2016-07-31 MED ORDER — METHYLPREDNISOLONE 4 MG PO TBPK: ORAL_TABLET | ORAL | 0 refills | Status: DC

## 2016-07-31 NOTE — Progress Notes (Signed)
He presents today for follow-up of plantar fasciitis to the right heel. He states that he needs his biannual shot that he gets every year for the plantar fasciitis he states it is been acting up Recently He Goes on to Say That His Left Heel Has Been Bothering Him Posteriorly.  Objective: Vital Signs Are Stable Alert and Oriented 3. Pulses Are Palpable. Neurologic Sensorium Is Intact. Deep Tendon Reflexes Are Intact. Pain on Palpation Medially Continued to Roll the Right Heel. Left Heel Does Demonstrate Her to Continue [place] Radiograph with Soft Tissue Increase in Density of the Plantar Fascia Calcaneal Insertion Site and the Achilles Insertion Site.  Assessment: Achilles Tendinitis and Plantar Fasciitis.  Plan: Injected the Bilateral Heels Today Plantarly and Posteriorly Placed Him in a Short Cam Walker with Medication and I Will Follow-Up with Him in 1 Month.

## 2016-08-28 ENCOUNTER — Ambulatory Visit: Payer: BLUE CROSS/BLUE SHIELD | Admitting: Podiatry

## 2016-08-30 ENCOUNTER — Ambulatory Visit (INDEPENDENT_AMBULATORY_CARE_PROVIDER_SITE_OTHER): Payer: BLUE CROSS/BLUE SHIELD | Admitting: Podiatry

## 2016-08-30 ENCOUNTER — Encounter: Payer: Self-pay | Admitting: Podiatry

## 2016-08-30 DIAGNOSIS — M722 Plantar fascial fibromatosis: Secondary | ICD-10-CM

## 2016-08-30 DIAGNOSIS — M7662 Achilles tendinitis, left leg: Secondary | ICD-10-CM | POA: Diagnosis not present

## 2016-08-30 MED ORDER — CELECOXIB 200 MG PO CAPS: 200.0000 mg | ORAL_CAPSULE | Freq: Two times a day (BID) | ORAL | 3 refills | Status: DC

## 2016-08-30 NOTE — Progress Notes (Signed)
He presents today for follow-up of his Achilles tendinitis to the left heel and plantar fasciitis to the right heel. He states that the left heel the tendon itself really doesn't hurting more just hurts down deep. He points to the deep aspects of his posterior ankle. The right foot however is still quite tender.  Objective: Her signs are stable he is alert and oriented 3. Pulses are palpable. He has pain on palpation of the deep posterior ankle and pain on palpation with plantar flexion of the posterior process of the left foot. Radiographs reviewed demonstrates what possibly could be a fracture of the posterior process. There does appear to be some soft tissue inflammation in the area. He has pain on palpation medially K Cyndie Chimeguyen will the right heel.  Assessment: Plantar fasciitis right posterior impingement syndrome of the talus/capsulitis left.  Plan I injected the posterior subtalar joint today with Kenalog and local anesthetic left. Isolated him that he probably does not need to wear the Cam Walker at this point. I also reinjected his right heel today with a lot of local anesthetic for the plantar fasciitis. He will continue his anti-inflammatories started him on Celebrex recommended he continue take this on a regular basis. Follow up with me in approximately a month

## 2016-09-25 ENCOUNTER — Ambulatory Visit: Payer: BLUE CROSS/BLUE SHIELD | Admitting: Podiatry

## 2017-06-05 DIAGNOSIS — M25531 Pain in right wrist: Secondary | ICD-10-CM | POA: Insufficient documentation

## 2017-06-05 DIAGNOSIS — I10 Essential (primary) hypertension: Secondary | ICD-10-CM | POA: Insufficient documentation

## 2023-06-06 ENCOUNTER — Ambulatory Visit (INDEPENDENT_AMBULATORY_CARE_PROVIDER_SITE_OTHER): Payer: 59

## 2023-06-06 ENCOUNTER — Encounter: Payer: Self-pay | Admitting: Podiatry

## 2023-06-06 ENCOUNTER — Ambulatory Visit (INDEPENDENT_AMBULATORY_CARE_PROVIDER_SITE_OTHER): Payer: 59 | Admitting: Podiatry

## 2023-06-06 DIAGNOSIS — M778 Other enthesopathies, not elsewhere classified: Secondary | ICD-10-CM

## 2023-06-06 DIAGNOSIS — S93602A Unspecified sprain of left foot, initial encounter: Secondary | ICD-10-CM

## 2023-06-06 DIAGNOSIS — M7752 Other enthesopathy of left foot: Secondary | ICD-10-CM | POA: Diagnosis not present

## 2023-06-06 MED ORDER — TRIAMCINOLONE ACETONIDE 40 MG/ML IJ SUSP
20.0000 mg | Freq: Once | INTRAMUSCULAR | Status: AC
  Administered 2023-06-06: 20 mg

## 2023-06-06 NOTE — Progress Notes (Signed)
  Subjective:  Patient ID: Jim Howard, male    DOB: [birth year],  MRN: [id number] HPI Chief Complaint  Patient presents with   Foot Pain    1st MPJ/met dorsally left - aching x couple months, him and his son was wrestling and felt something pop, hurts a lot when walking, tried aleve   New Patient (Initial Visit)    Est pt 2018    47 y.o. male presents with the above complaint.   ROS: Denies fever chills nausea vomit muscle aches pains calf pain back pain chest pain shortness of breath.  Past Medical History:  Diagnosis Date   H/O vasectomy    Testosterone deficiency    Past Surgical History:  Procedure Laterality Date   LEFT INGUINAL HERNIA REPAIR  12 MONTHS OLD   TONSILLECTOMY / ADENOID ABLATION  08-09-2008   VASECTOMY  11/22/2011   Procedure: VASECTOMY;  Surgeon: Anner Crete, MD;  Location: [facility];  Service: Urology;  Laterality: Left;  REDO VASECTOMY LEFT     Current Outpatient Medications:    amLODipine (NORVASC) 2.5 MG tablet, Take 1 tablet by mouth daily., Disp: , Rfl:    anastrozole (ARIMIDEX) 1 MG tablet, Take by mouth., Disp: , Rfl:   No Known Allergies Review of Systems Objective:  There were no vitals filed for this visit.  General: Well developed, nourished, in no acute distress, alert and oriented x3   Dermatological: Skin is warm, dry and supple bilateral. Nails x 10 are well maintained; remaining integument appears unremarkable at this time. There are no open sores, no preulcerative lesions, no rash or signs of infection present.  Vascular: Dorsalis Pedis artery and Posterior Tibial artery pedal pulses are 2/4 bilateral with immedate capillary fill time. Pedal hair growth present. No varicosities and no lower extremity edema present bilateral.   Neruologic: Grossly intact via light touch bilateral. Vibratory intact via tuning fork bilateral. Protective threshold with Semmes Wienstein monofilament intact to all pedal sites bilateral.  Patellar and Achilles deep tendon reflexes 2+ bilateral. No Babinski or clonus noted bilateral.   Musculoskeletal: No gross boney pedal deformities bilateral. No pain, crepitus, or limitation noted with foot and ankle range of motion bilateral. Muscular strength 5/5 in all groups tested bilateral.  He has pain on palpation of the proximal aspect of the first intermetatarsal space close to the Lisfranc's ligament.  No swelling no signs of fracture or infection.  Gait: Unassisted, Nonantalgic.    Radiographs:  Radiographs taken today demonstrate an osseously mature foot no significant osseous abnormalities mild plantar spur forefoot does not demonstrate any significant osteoarthritic change there is some sclerosis at the attachment site of the Lisfranc's ligament but no avulsion is identified.  Assessment & Plan:   Assessment: Probable Lisfranc strain or sprain proximal first and metatarsal space left foot.  Plan: Placed a small amount of local anesthetic into today with 10 mg of Kenalog to the point of maximal tenderness.  I also put him in a cam boot.  I would like to follow-up with him in 1 month if not improved consider MRI at that time.     Jim Howard, [place]

## 2023-07-11 ENCOUNTER — Ambulatory Visit: Payer: 59 | Admitting: Podiatry

## 2023-08-01 ENCOUNTER — Ambulatory Visit: Payer: 59 | Admitting: Podiatry
